# Patient Record
Sex: Female | Born: 1975 | Race: Black or African American | Hispanic: No | Marital: Married | Smoking: Current every day smoker
Health system: Southern US, Community
[De-identification: ages and names within clinical notes are randomized; demographics above are authoritative.]

## PROBLEM LIST (undated history)

## (undated) DIAGNOSIS — Q249 Congenital malformation of heart, unspecified: Secondary | ICD-10-CM

## (undated) HISTORY — PX: CARDIAC SURGERY: SHX584

---

## 2014-12-11 ENCOUNTER — Emergency Department (HOSPITAL_COMMUNITY): Payer: Medicare Other

## 2014-12-11 ENCOUNTER — Emergency Department (HOSPITAL_COMMUNITY)
Admission: EM | Admit: 2014-12-11 | Discharge: 2014-12-11 | Disposition: A | Discharge status: Home / Self Care | Payer: Medicare Other | Attending: Emergency Medicine | Admitting: Emergency Medicine

## 2014-12-11 ENCOUNTER — Encounter (HOSPITAL_COMMUNITY): Payer: Self-pay | Admitting: *Deleted

## 2014-12-11 DIAGNOSIS — Y9389 Activity, other specified: Secondary | ICD-10-CM | POA: Diagnosis not present

## 2014-12-11 DIAGNOSIS — S01511A Laceration without foreign body of lip, initial encounter: Secondary | ICD-10-CM | POA: Diagnosis not present

## 2014-12-11 DIAGNOSIS — F1410 Cocaine abuse, uncomplicated: Secondary | ICD-10-CM | POA: Insufficient documentation

## 2014-12-11 DIAGNOSIS — Y9289 Other specified places as the place of occurrence of the external cause: Secondary | ICD-10-CM | POA: Insufficient documentation

## 2014-12-11 DIAGNOSIS — Z720 Tobacco use: Secondary | ICD-10-CM | POA: Insufficient documentation

## 2014-12-11 DIAGNOSIS — Z880 Allergy status to penicillin: Secondary | ICD-10-CM | POA: Insufficient documentation

## 2014-12-11 DIAGNOSIS — F1210 Cannabis abuse, uncomplicated: Secondary | ICD-10-CM | POA: Insufficient documentation

## 2014-12-11 DIAGNOSIS — Y998 Other external cause status: Secondary | ICD-10-CM | POA: Insufficient documentation

## 2014-12-11 DIAGNOSIS — Z3202 Encounter for pregnancy test, result negative: Secondary | ICD-10-CM | POA: Diagnosis not present

## 2014-12-11 DIAGNOSIS — T148 Other injury of unspecified body region: Secondary | ICD-10-CM | POA: Diagnosis not present

## 2014-12-11 DIAGNOSIS — S0990XA Unspecified injury of head, initial encounter: Secondary | ICD-10-CM | POA: Diagnosis present

## 2014-12-11 DIAGNOSIS — S060X1A Concussion with loss of consciousness of 30 minutes or less, initial encounter: Secondary | ICD-10-CM | POA: Insufficient documentation

## 2014-12-11 DIAGNOSIS — T148XXA Other injury of unspecified body region, initial encounter: Secondary | ICD-10-CM

## 2014-12-11 HISTORY — DX: Congenital malformation of heart, unspecified: Q24.9

## 2014-12-11 LAB — RAPID URINE DRUG SCREEN, HOSP PERFORMED
AMPHETAMINES: NOT DETECTED
BARBITURATES: NOT DETECTED
BENZODIAZEPINES: NOT DETECTED
COCAINE: POSITIVE — AB
Opiates: NOT DETECTED
TETRAHYDROCANNABINOL: POSITIVE — AB

## 2014-12-11 LAB — URINALYSIS, ROUTINE W REFLEX MICROSCOPIC
BILIRUBIN URINE: NEGATIVE
GLUCOSE, UA: NEGATIVE mg/dL
Hgb urine dipstick: NEGATIVE
KETONES UR: NEGATIVE mg/dL
Leukocytes, UA: NEGATIVE
Nitrite: NEGATIVE
PH: 5.5 (ref 5.0–8.0)
PROTEIN: NEGATIVE mg/dL
Specific Gravity, Urine: 1.01 (ref 1.005–1.030)
UROBILINOGEN UA: 0.2 mg/dL (ref 0.0–1.0)

## 2014-12-11 LAB — CBC WITH DIFFERENTIAL/PLATELET
BASOS PCT: 1 % (ref 0–1)
Basophils Absolute: 0 10*3/uL (ref 0.0–0.1)
EOS PCT: 1 % (ref 0–5)
Eosinophils Absolute: 0 10*3/uL (ref 0.0–0.7)
HCT: 37.7 % (ref 36.0–46.0)
HEMOGLOBIN: 12.6 g/dL (ref 12.0–15.0)
LYMPHS ABS: 1.4 10*3/uL (ref 0.7–4.0)
Lymphocytes Relative: 32 % (ref 12–46)
MCH: 29.9 pg (ref 26.0–34.0)
MCHC: 33.4 g/dL (ref 30.0–36.0)
MCV: 89.5 fL (ref 78.0–100.0)
MONO ABS: 0.4 10*3/uL (ref 0.1–1.0)
MONOS PCT: 9 % (ref 3–12)
NEUTROS ABS: 2.5 10*3/uL (ref 1.7–7.7)
NEUTROS PCT: 59 % (ref 43–77)
PLATELETS: 160 10*3/uL (ref 150–400)
RBC: 4.21 MIL/uL (ref 3.87–5.11)
RDW: 13.4 % (ref 11.5–15.5)
WBC: 4.3 10*3/uL (ref 4.0–10.5)

## 2014-12-11 LAB — PREGNANCY, URINE: Preg Test, Ur: NEGATIVE

## 2014-12-11 LAB — BASIC METABOLIC PANEL
Anion gap: 8 (ref 5–15)
BUN: 10 mg/dL (ref 6–20)
CHLORIDE: 108 mmol/L (ref 101–111)
CO2: 23 mmol/L (ref 22–32)
Calcium: 8.6 mg/dL — ABNORMAL LOW (ref 8.9–10.3)
Creatinine, Ser: 1 mg/dL (ref 0.44–1.00)
GFR calc Af Amer: >60 mL/min (ref >60)
GFR calc non Af Amer: >60 mL/min (ref >60)
Glucose, Bld: 83 mg/dL (ref 65–99)
Potassium: 3.9 mmol/L (ref 3.5–5.1)
Sodium: 139 mmol/L (ref 135–145)

## 2014-12-11 LAB — ETHANOL: Alcohol, Ethyl (B): 93 mg/dL — ABNORMAL HIGH (ref <5)

## 2014-12-11 MED ORDER — FENTANYL CITRATE (PF) 100 MCG/2ML IJ SOLN
50.0000 ug | Freq: Once | INTRAMUSCULAR | Status: AC
  Administered 2014-12-11: 50 ug via INTRAVENOUS
  Filled 2014-12-11: qty 2

## 2014-12-11 MED ORDER — TETANUS-DIPHTH-ACELL PERTUSSIS 5-2.5-18.5 LF-MCG/0.5 IM SUSP
0.5000 mL | Freq: Once | INTRAMUSCULAR | Status: DC
  Filled 2014-12-11: qty 0.5
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
## 2014-12-11 NOTE — Discharge Instructions (Signed)
Follow up with dr. Gerilyn Pilgrim or a family md if any problems     Concussion A concussion, or closed-head injury, is a brain injury caused by a direct blow to the head or by a quick and sudden movement (jolt) of the head or neck. Concussions are usually not life-threatening. Even so, the effects of a concussion can be serious. If you have had a concussion before, you are more likely to experience concussion-like symptoms after a direct blow to the head.  CAUSES  Direct blow to the head, such as from running into another player during a soccer game, being hit in a fight, or hitting your head on a hard surface.  A jolt of the head or neck that causes the brain to move back and forth inside the skull, such as in a car crash. SIGNS AND SYMPTOMS The signs of a concussion can be hard to notice. Early on, they may be missed by you, family members, and health care providers. You may look fine but act or feel differently. Symptoms are usually temporary, but they may last for days, weeks, or even longer. Some symptoms may appear right away while others may not show up for hours or days. Every head injury is different. Symptoms include:  Mild to moderate headaches that will not go away.  A feeling of pressure inside your head.  Having more trouble than usual:  Learning or remembering things you have heard.  Answering questions.  Paying attention or concentrating.  Organizing daily tasks.  Making decisions and solving problems.  Slowness in thinking, acting or reacting, speaking, or reading.  Getting lost or being easily confused.  Feeling tired all the time or lacking energy (fatigued).  Feeling drowsy.  Sleep disturbances.  Sleeping more than usual.  Sleeping less than usual.  Trouble falling asleep.  Trouble sleeping (insomnia).  Loss of balance or feeling lightheaded or dizzy.  Nausea or vomiting.  Numbness or tingling.  Increased sensitivity  to:  Sounds.  Lights.  Distractions.  Vision problems or eyes that tire easily.  Diminished sense of taste or smell.  Ringing in the ears.  Mood changes such as feeling sad or anxious.  Becoming easily irritated or angry for little or no reason.  Lack of motivation.  Seeing or hearing things other people do not see or hear (hallucinations). DIAGNOSIS Your health care provider can usually diagnose a concussion based on a description of your injury and symptoms. He or she will ask whether you passed out (lost consciousness) and whether you are having trouble remembering events that happened right before and during your injury. Your evaluation might include:  A brain scan to look for signs of injury to the brain. Even if the test shows no injury, you may still have a concussion.  Blood tests to be sure other problems are not present. TREATMENT  Concussions are usually treated in an emergency department, in urgent care, or at a clinic. You may need to stay in the hospital overnight for further treatment.  Tell your health care provider if you are taking any medicines, including prescription medicines, over-the-counter medicines, and natural remedies. Some medicines, such as blood thinners (anticoagulants) and aspirin, may increase the chance of complications. Also tell your health care provider whether you have had alcohol or are taking illegal drugs. This information may affect treatment.  Your health care provider will send you home with important instructions to follow.  How fast you will recover from a concussion depends on many factors.  These factors include how severe your concussion is, what part of your brain was injured, your age, and how healthy you were before the concussion.  Most people with mild injuries recover fully. Recovery can take time. In general, recovery is slower in older persons. Also, persons who have had a concussion in the past or have other medical  problems may find that it takes longer to recover from their current injury. HOME CARE INSTRUCTIONS General Instructions  Carefully follow the directions your health care provider gave you.  Only take over-the-counter or prescription medicines for pain, discomfort, or fever as directed by your health care provider.  Take only those medicines that your health care provider has approved.  Do not drink alcohol until your health care provider says you are well enough to do so. Alcohol and certain other drugs may slow your recovery and can put you at risk of further injury.  If it is harder than usual to remember things, write them down.  If you are easily distracted, try to do one thing at a time. For example, do not try to watch TV while fixing dinner.  Talk with family members or close friends when making important decisions.  Keep all follow-up appointments. Repeated evaluation of your symptoms is recommended for your recovery.  Watch your symptoms and tell others to do the same. Complications sometimes occur after a concussion. Older adults with a brain injury may have a higher risk of serious complications, such as a blood clot on the brain.  Tell your teachers, school nurse, school counselor, coach, athletic trainer, or work Freight forwarder about your injury, symptoms, and restrictions. Tell them about what you can or cannot do. They should watch for:  Increased problems with attention or concentration.  Increased difficulty remembering or learning new information.  Increased time needed to complete tasks or assignments.  Increased irritability or decreased ability to cope with stress.  Increased symptoms.  Rest. Rest helps the brain to heal. Make sure you:  Get plenty of sleep at night. Avoid staying up late at night.  Keep the same bedtime hours on weekends and weekdays.  Rest during the day. Take daytime naps or rest breaks when you feel tired.  Limit activities that require a  lot of thought or concentration. These include:  Doing homework or job-related work.  Watching TV.  Working on the computer.  Avoid any situation where there is potential for another head injury (football, hockey, soccer, basketball, martial arts, downhill snow sports and horseback riding). Your condition will get worse every time you experience a concussion. You should avoid these activities until you are evaluated by the appropriate follow-up health care providers. Returning To Your Regular Activities You will need to return to your normal activities slowly, not all at once. You must give your body and brain enough time for recovery.  Do not return to sports or other athletic activities until your health care provider tells you it is safe to do so.  Ask your health care provider when you can drive, ride a bicycle, or operate heavy machinery. Your ability to react may be slower after a brain injury. Never do these activities if you are dizzy.  Ask your health care provider about when you can return to work or school. Preventing Another Concussion It is very important to avoid another brain injury, especially before you have recovered. In rare cases, another injury can lead to permanent brain damage, brain swelling, or death. The risk of this is greatest  during the first 7-10 days after a head injury. Avoid injuries by:  Wearing a seat belt when riding in a car.  Drinking alcohol only in moderation.  Wearing a helmet when biking, skiing, skateboarding, skating, or doing similar activities.  Avoiding activities that could lead to a second concussion, such as contact or recreational sports, until your health care provider says it is okay.  Taking safety measures in your home.  Remove clutter and tripping hazards from floors and stairways.  Use grab bars in bathrooms and handrails by stairs.  Place non-slip mats on floors and in bathtubs.  Improve lighting in dim areas. SEEK MEDICAL  CARE IF:  You have increased problems paying attention or concentrating.  You have increased difficulty remembering or learning new information.  You need more time to complete tasks or assignments than before.  You have increased irritability or decreased ability to cope with stress.  You have more symptoms than before. Seek medical care if you have any of the following symptoms for more than 2 weeks after your injury:  Lasting (chronic) headaches.  Dizziness or balance problems.  Nausea.  Vision problems.  Increased sensitivity to noise or light.  Depression or mood swings.  Anxiety or irritability.  Memory problems.  Difficulty concentrating or paying attention.  Sleep problems.  Feeling tired all the time. SEEK IMMEDIATE MEDICAL CARE IF:  You have severe or worsening headaches. These may be a sign of a blood clot in the brain.  You have weakness (even if only in one hand, leg, or part of the face).  You have numbness.  You have decreased coordination.  You vomit repeatedly.  You have increased sleepiness.  One pupil is larger than the other.  You have convulsions.  You have slurred speech.  You have increased confusion. This may be a sign of a blood clot in the brain.  You have increased restlessness, agitation, or irritability.  You are unable to recognize people or places.  You have neck pain.  It is difficult to wake you up.  You have unusual behavior changes.  You lose consciousness. MAKE SURE YOU:  Understand these instructions.  Will watch your condition.  Will get help right away if you are not doing well or get worse. Document Released: 07/07/2003 Document Revised: 04/21/2013 Document Reviewed: 11/06/2012 [facility] Patient Information 2015 [place], [place]. This information is not intended to replace advice given to you by your health care provider. Make sure you discuss any questions you have with your health care  provider.  Contusion A contusion is a deep bruise. Contusions are the result of an injury that caused bleeding under the skin. The contusion may turn blue, purple, or yellow. Minor injuries will give you a painless contusion, but more severe contusions may stay painful and swollen for a few weeks.  CAUSES  A contusion is usually caused by a blow, trauma, or direct force to an area of the body. SYMPTOMS   Swelling and redness of the injured area.  Bruising of the injured area.  Tenderness and soreness of the injured area.  Pain. DIAGNOSIS  The diagnosis can be made by taking a history and physical exam. An X-ray, CT scan, or MRI may be needed to determine if there were any associated injuries, such as fractures. TREATMENT  Specific treatment will depend on what area of the body was injured. In general, the best treatment for a contusion is resting, icing, elevating, and applying cold compresses to the injured area. Over-the-counter  medicines may also be recommended for pain control. Ask your caregiver what the best treatment is for your contusion. HOME CARE INSTRUCTIONS   Put ice on the injured area.  Put ice in a plastic bag.  Place a towel between your skin and the bag.  Leave the ice on for 15-20 minutes, 3-4 times a day, or as directed by your health care provider.  Only take over-the-counter or prescription medicines for pain, discomfort, or fever as directed by your caregiver. Your caregiver may recommend avoiding anti-inflammatory medicines (aspirin, ibuprofen, and naproxen) for 48 hours because these medicines may increase bruising.  Rest the injured area.  If possible, elevate the injured area to reduce swelling. SEEK IMMEDIATE MEDICAL CARE IF:   You have increased bruising or swelling.  You have pain that is getting worse.  Your swelling or pain is not relieved with medicines. MAKE SURE YOU:   Understand these instructions.  Will watch your condition.  Will get  help right away if you are not doing well or get worse. Document Released: 01/24/2005 Document Revised: 04/21/2013 Document Reviewed: 02/19/2011 [facility] Patient Information 2015 [place], [place]. This information is not intended to replace advice given to you by your health care provider. Make sure you discuss any questions you have with your health care provider.
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
## 2019-11-15 ENCOUNTER — Encounter (HOSPITAL_COMMUNITY): Payer: Self-pay | Admitting: Emergency Medicine

## 2019-11-15 ENCOUNTER — Emergency Department (HOSPITAL_COMMUNITY)
Admission: EM | Admit: 2019-11-15 | Discharge: 2019-11-15 | Disposition: A | Discharge status: Home / Self Care | Payer: Medicaid Other | Attending: Emergency Medicine | Admitting: Emergency Medicine

## 2019-11-15 ENCOUNTER — Other Ambulatory Visit: Payer: Self-pay

## 2019-11-15 DIAGNOSIS — Y9389 Activity, other specified: Secondary | ICD-10-CM | POA: Insufficient documentation

## 2019-11-15 DIAGNOSIS — Y9289 Other specified places as the place of occurrence of the external cause: Secondary | ICD-10-CM | POA: Diagnosis not present

## 2019-11-15 DIAGNOSIS — T192XXA Foreign body in vulva and vagina, initial encounter: Secondary | ICD-10-CM | POA: Diagnosis not present

## 2019-11-15 DIAGNOSIS — F17200 Nicotine dependence, unspecified, uncomplicated: Secondary | ICD-10-CM | POA: Diagnosis not present

## 2019-11-15 DIAGNOSIS — Y999 Unspecified external cause status: Secondary | ICD-10-CM | POA: Insufficient documentation

## 2019-11-15 LAB — URINALYSIS, ROUTINE W REFLEX MICROSCOPIC
Bilirubin Urine: NEGATIVE
Glucose, UA: NEGATIVE mg/dL
Ketones, ur: NEGATIVE mg/dL
Leukocytes,Ua: NEGATIVE
Nitrite: NEGATIVE
Protein, ur: NEGATIVE mg/dL
Specific Gravity, Urine: 1.013 (ref 1.005–1.030)
pH: 5 (ref 5.0–8.0)

## 2019-11-15 LAB — WET PREP, GENITAL
Clue Cells Wet Prep HPF POC: NONE SEEN
Sperm: NONE SEEN
Trich, Wet Prep: NONE SEEN
Yeast Wet Prep HPF POC: NONE SEEN

## 2019-11-15 LAB — PREGNANCY, URINE: Preg Test, Ur: NEGATIVE

## 2019-11-15 MED ORDER — DOXYCYCLINE HYCLATE 100 MG PO CAPS: 100.0000 mg | ORAL_CAPSULE | Freq: Two times a day (BID) | ORAL | 0 refills | Status: AC

## 2019-11-15 MED ORDER — CEFTRIAXONE SODIUM 500 MG IJ SOLR
500.0000 mg | Freq: Once | INTRAMUSCULAR | Status: AC
  Administered 2019-11-15: 500 mg via INTRAMUSCULAR
  Filled 2019-11-15: qty 500

## 2019-11-15 MED ORDER — DOXYCYCLINE HYCLATE 100 MG PO TABS
100.0000 mg | ORAL_TABLET | Freq: Once | ORAL | Status: AC
  Administered 2019-11-15: 100 mg via ORAL
  Filled 2019-11-15: qty 1

## 2019-11-15 MED ORDER — STERILE WATER FOR INJECTION IJ SOLN
INTRAMUSCULAR | Status: AC
  Administered 2019-11-15: 10 mL
  Filled 2019-11-15: qty 10

## 2019-11-15 NOTE — ED Provider Notes (Signed)
[facility] EMERGENCY DEPARTMENT Provider Note   CSN: [id number] Arrival date & time: 11/15/19  0522     History Chief Complaint  Patient presents with  . Foreign Body in Vagina    Pa Downard is a 44 y.o. female.  Patient reports condom stuck in the vagina after having intercourse today with a new partner.  Happened just prior to arrival.  Has discomfort and pain in her vagina.  No bleeding or discharge.  Denies any pain with urination or blood in the urine.  No fever or vomiting.  No abdominal pain or back pain.  Denies possibility of pregnancy. States this is a new partner today and she is worried about being exposed to something he has.  Chart review shows she was treated for trichomonas at [facility] in June.  The history is provided by the patient.  Foreign Body in Vagina Pertinent negatives include no chest pain, no abdominal pain, no headaches and no shortness of breath.       Past Medical History:  Diagnosis Date  . Congenital heart defect     There are no problems to display for this patient.   Past Surgical History:  Procedure Laterality Date  . CARDIAC SURGERY       OB History   No obstetric history on file.     No family history on file.  Social History   Tobacco Use  . Smoking status: Current Every Day Smoker  Substance Use Topics  . Alcohol use: Yes  . Drug use: Yes    Home Medications Prior to Admission medications   Not on File    Allergies    Amoxicillin and Penicillins  Review of Systems   Review of Systems  Constitutional: Negative for activity change, appetite change and fever.  HENT: Negative for congestion and rhinorrhea.   Eyes: Negative for visual disturbance.  Respiratory: Negative for cough, chest tightness and shortness of breath.   Cardiovascular: Negative for chest pain.  Gastrointestinal: Negative for abdominal pain.  Genitourinary: Negative for dysuria, flank pain, hematuria, vaginal bleeding and vaginal  discharge.  Musculoskeletal: Negative for arthralgias and myalgias.  Neurological: Negative for dizziness, weakness and headaches.   all other systems are negative except as noted in the HPI and PMH.    Physical Exam Updated Vital Signs BP 103/76 (BP Location: Right Arm)   Pulse 76   Temp 98.4 F (36.9 C) (Oral)   Resp 17   LMP 11/08/2019   SpO2 99%   Physical Exam Vitals and nursing note reviewed.  Constitutional:      General: She is not in acute distress.    Appearance: She is well-developed. She is obese.  HENT:     Head: Normocephalic and atraumatic.     Mouth/Throat:     Pharynx: No oropharyngeal exudate.  Eyes:     Conjunctiva/sclera: Conjunctivae normal.     Pupils: Pupils are equal, round, and reactive to light.  Neck:     Comments: No meningismus. Cardiovascular:     Rate and Rhythm: Normal rate and regular rhythm.     Heart sounds: Normal heart sounds. No murmur heard.   Pulmonary:     Effort: Pulmonary effort is normal. No respiratory distress.     Breath sounds: Normal breath sounds.  Abdominal:     Palpations: Abdomen is soft.     Tenderness: There is no abdominal tenderness. There is no guarding or rebound.  Genitourinary:    Comments: Chaperone present.  Normal external  genitalia.  Condom visualized easily with speculum and removed with McGill forceps.  No CMT.  No lateralized adnexal tenderness.  No vaginal bleeding or discharge. Musculoskeletal:        General: No tenderness. Normal range of motion.     Cervical back: Normal range of motion and neck supple.  Skin:    General: Skin is warm.  Neurological:     Mental Status: She is alert and oriented to person, place, and time.     Cranial Nerves: No cranial nerve deficit.     Motor: No abnormal muscle tone.     Coordination: Coordination normal.     Comments:  5/5 strength throughout. CN 2-12 intact.Equal grip strength.   Psychiatric:        Behavior: Behavior normal.     ED Results /  Procedures / Treatments   Labs (all labs ordered are listed, but only abnormal results are displayed) Labs Reviewed  WET PREP, GENITAL - Abnormal; Notable for the following components:      Result Value   WBC, Wet Prep HPF POC FEW (*)    All other components within normal limits  URINALYSIS, ROUTINE W REFLEX MICROSCOPIC - Abnormal; Notable for the following components:   Hgb urine dipstick SMALL (*)    Bacteria, UA RARE (*)    All other components within normal limits  PREGNANCY, URINE  GC/CHLAMYDIA PROBE AMP ([place]) NOT AT [facility]    EKG None  Radiology No results found.  Procedures .Foreign Body Removal  Date/Time: 11/15/2019 5:47 AM Performed by: Glynn Octave, MD Authorized by: Glynn Octave, MD  Body area: vagina Anesthesia method: none.  Sedation: Patient sedated: no  Patient restrained: no Patient cooperative: yes Localization method: visualized Removal mechanism: ring forceps Complexity: simple 1 objects recovered. Objects recovered: condom Post-procedure assessment: foreign body removed Patient tolerance: patient tolerated the procedure well with no immediate complications   (including critical care time)  Medications Ordered in ED Medications - No data to display  ED Course  I have reviewed the triage vital signs and the nursing notes.  Pertinent labs & imaging results that were available during my care of the patient were reviewed by me and considered in my medical decision making (see chart for details).    MDM Rules/Calculators/A&P                         Foreign body in vagina.  Condom removed as above. Abdomen soft without peritoneal signs.   Patient requesting STD testing as well as empiric treatment for possible gonorrhea and chlamydia.  hCG is negative.  Patient treated empirically with Rocephin and doxycycline.  Wet prep is negative for trichomonas or clue cells. Follow-up with gynecology.  Advised to have her sexual partner  treated as well.  Return to the ED with worsening pain, fever, vomiting, other concerns. Final Clinical Impression(s) / ED Diagnoses Final diagnoses:  Foreign body in vagina, initial encounter    Rx / DC Orders ED Discharge Orders    None       Neno Hohensee, Jeannett Senior, MD 11/15/19 [phone]

## 2019-11-15 NOTE — Discharge Instructions (Signed)
Take the antibiotics as prescribed.  You were treated for gonorrhea and chlamydia.  You may check the results online in the next 1 to 2 days.  Follow-up with a gynecologist.  Return to the ED with worsening pain, fever, vomiting, or other concerns.

## 2019-11-15 NOTE — ED Triage Notes (Signed)
Pt reports condom stuck in vagina. Pt states it happened just prior to arriving in the ED tonight.

## 2019-11-16 LAB — GC/CHLAMYDIA PROBE AMP (~~LOC~~) NOT AT ARMC
Chlamydia: NEGATIVE
Comment: NEGATIVE
Comment: NORMAL
Neisseria Gonorrhea: NEGATIVE

## 2021-03-04 ENCOUNTER — Emergency Department (HOSPITAL_COMMUNITY): Payer: Medicaid Other

## 2021-03-04 ENCOUNTER — Encounter (HOSPITAL_COMMUNITY): Payer: Self-pay | Admitting: Emergency Medicine

## 2021-03-04 ENCOUNTER — Other Ambulatory Visit: Payer: Self-pay

## 2021-03-04 ENCOUNTER — Emergency Department (HOSPITAL_COMMUNITY)
Admission: EM | Admit: 2021-03-04 | Discharge: 2021-03-04 | Disposition: A | Discharge status: Home / Self Care | Payer: Medicaid Other | Attending: Emergency Medicine | Admitting: Emergency Medicine

## 2021-03-04 DIAGNOSIS — R1032 Left lower quadrant pain: Secondary | ICD-10-CM | POA: Insufficient documentation

## 2021-03-04 DIAGNOSIS — R109 Unspecified abdominal pain: Secondary | ICD-10-CM

## 2021-03-04 DIAGNOSIS — R1084 Generalized abdominal pain: Secondary | ICD-10-CM | POA: Insufficient documentation

## 2021-03-04 DIAGNOSIS — F17200 Nicotine dependence, unspecified, uncomplicated: Secondary | ICD-10-CM | POA: Insufficient documentation

## 2021-03-04 DIAGNOSIS — R1031 Right lower quadrant pain: Secondary | ICD-10-CM | POA: Diagnosis not present

## 2021-03-04 LAB — COMPREHENSIVE METABOLIC PANEL
ALT: 15 U/L (ref 0–44)
AST: 33 U/L (ref 15–41)
Albumin: 3.8 g/dL (ref 3.5–5.0)
Alkaline Phosphatase: 52 U/L (ref 38–126)
Anion gap: 8 (ref 5–15)
BUN: 16 mg/dL (ref 6–20)
CO2: 21 mmol/L — ABNORMAL LOW (ref 22–32)
Calcium: 8.8 mg/dL — ABNORMAL LOW (ref 8.9–10.3)
Chloride: 106 mmol/L (ref 98–111)
Creatinine, Ser: 0.8 mg/dL (ref 0.44–1.00)
GFR, Estimated: >60 mL/min (ref >60)
Glucose, Bld: 100 mg/dL — ABNORMAL HIGH (ref 70–99)
Potassium: 3.8 mmol/L (ref 3.5–5.1)
Sodium: 135 mmol/L (ref 135–145)
Total Bilirubin: 0.3 mg/dL (ref 0.3–1.2)
Total Protein: 7.5 g/dL (ref 6.5–8.1)

## 2021-03-04 LAB — CBC
HCT: 39.7 % (ref 36.0–46.0)
Hemoglobin: 12.9 g/dL (ref 12.0–15.0)
MCH: 28.9 pg (ref 26.0–34.0)
MCHC: 32.5 g/dL (ref 30.0–36.0)
MCV: 89 fL (ref 80.0–100.0)
Platelets: 175 10*3/uL (ref 150–400)
RBC: 4.46 MIL/uL (ref 3.87–5.11)
RDW: 14.1 % (ref 11.5–15.5)
WBC: 5.7 10*3/uL (ref 4.0–10.5)
nRBC: 0 % (ref 0.0–0.2)

## 2021-03-04 LAB — URINALYSIS, ROUTINE W REFLEX MICROSCOPIC
Bilirubin Urine: NEGATIVE
Glucose, UA: NEGATIVE mg/dL
Hgb urine dipstick: NEGATIVE
Ketones, ur: NEGATIVE mg/dL
Leukocytes,Ua: NEGATIVE
Nitrite: NEGATIVE
Protein, ur: NEGATIVE mg/dL
Specific Gravity, Urine: 1.021 (ref 1.005–1.030)
pH: 6 (ref 5.0–8.0)

## 2021-03-04 LAB — PREGNANCY, URINE: Preg Test, Ur: NEGATIVE

## 2021-03-04 LAB — LIPASE, BLOOD: Lipase: 181 U/L — ABNORMAL HIGH (ref 11–51)

## 2021-03-04 MED ORDER — OMEPRAZOLE 20 MG PO CPDR: 20.0000 mg | DELAYED_RELEASE_CAPSULE | Freq: Every day | ORAL | 1 refills | Status: AC

## 2021-03-04 MED ORDER — SODIUM CHLORIDE 0.9 % IV BOLUS
1000.0000 mL | Freq: Once | INTRAVENOUS | Status: AC
  Administered 2021-03-04: 1000 mL via INTRAVENOUS

## 2021-03-04 MED ORDER — FAMOTIDINE IN NACL 20-0.9 MG/50ML-% IV SOLN
20.0000 mg | Freq: Once | INTRAVENOUS | Status: AC
  Administered 2021-03-04: 20 mg via INTRAVENOUS
  Filled 2021-03-04: qty 50

## 2021-03-04 MED ORDER — HALOPERIDOL LACTATE 5 MG/ML IJ SOLN
2.0000 mg | Freq: Once | INTRAMUSCULAR | Status: AC
  Administered 2021-03-04: 2 mg via INTRAVENOUS
  Filled 2021-03-04: qty 1

## 2021-03-04 MED ORDER — IOHEXOL 300 MG/ML  SOLN
100.0000 mL | Freq: Once | INTRAMUSCULAR | Status: AC | PRN
  Administered 2021-03-04: 100 mL via INTRAVENOUS

## 2021-03-04 MED ORDER — OXYCODONE HCL 5 MG PO TABS: 5.0000 mg | ORAL_TABLET | ORAL | 0 refills | Status: AC | PRN

## 2021-03-04 MED ORDER — SUCRALFATE 1 G PO TABS: 1.0000 g | ORAL_TABLET | Freq: Four times a day (QID) | ORAL | 0 refills | Status: AC | PRN

## 2021-03-04 MED ORDER — HYDROMORPHONE HCL 1 MG/ML IJ SOLN
1.0000 mg | Freq: Once | INTRAMUSCULAR | Status: AC
  Administered 2021-03-04: 1 mg via INTRAVENOUS
  Filled 2021-03-04: qty 1

## 2021-03-04 MED ORDER — ACETAMINOPHEN 500 MG PO TABS
1000.0000 mg | ORAL_TABLET | Freq: Once | ORAL | Status: AC
  Administered 2021-03-04: 1000 mg via ORAL
  Filled 2021-03-04: qty 2

## 2021-03-04 NOTE — Discharge Instructions (Signed)
You were evaluated in the Emergency Department and after careful evaluation, we did not find any emergent condition requiring admission or further testing in the hospital.  Your exam/testing today is overall reassuring.  Recommend using the omeprazole medication daily to prevent pain.  Use the Carafate medication up to 4 times daily for discomfort.  Can also use the oxycodone medicine for more significant pain.  Limit your diet to liquids or broths for the next 2 days as we discussed.  Please return to the Emergency Department if you experience any worsening of your condition.   Thank you for allowing [place] to be a part of your care.

## 2021-03-04 NOTE — ED Provider Notes (Signed)
[facility]-EMERGENCY DEPT [facility] Emergency Department Provider Note MRN:  [id number]  Arrival date & time: 03/04/21     Chief Complaint   Abdominal Pain   History of Present Illness   Kristina Hebert is a 45 y.o. year-old female with no pertinent past medical history presenting to the ED with chief complaint of abdominal pain.  Location: Diffuse, worst in the lower quadrants Duration: 2 days Onset: Gradual Timing: Constant Description: Dull ache, bloated Severity: Moderate Exacerbating/Alleviating Factors: None Associated Symptoms: White stools, only 1 day of vaginal bleeding in the past 3 months Pertinent Negatives: No fever, no vomiting, no chest pain or shortness of breath  Additional History: None  Review of Systems  A complete 10 system review of systems was obtained and all systems are negative except as noted in the HPI and PMH.   Patient's Health History    Past Medical History:  Diagnosis Date   Congenital heart defect     Past Surgical History:  Procedure Laterality Date   CARDIAC SURGERY      History reviewed. No pertinent family history.  Social History   Socioeconomic History   Marital status: Married    Spouse name: Not on file   Number of children: Not on file   Years of education: Not on file   Highest education level: Not on file  Occupational History   Not on file  Tobacco Use   Smoking status: Every Day   Smokeless tobacco: Never  Substance and Sexual Activity   Alcohol use: Yes   Drug use: Yes   Sexual activity: Not on file  Other Topics Concern   Not on file  Social History Narrative   Not on file   Social Determinants of Health   Financial Resource Strain: Not on file  Food Insecurity: Not on file  Transportation Needs: Not on file  Physical Activity: Not on file  Stress: Not on file  Social Connections: Not on file  Intimate Partner Violence: Not on file     Physical Exam   Vitals:   03/04/21 0500 03/04/21 0600  BP:  115/73 98/67  Pulse: (!) 52 (!) 53  Resp: 18 18  Temp:    SpO2: 100% 100%    CONSTITUTIONAL: Well-appearing, NAD NEURO:  Alert and oriented x 3, no focal deficits EYES:  eyes equal and reactive ENT/NECK:  no LAD, no JVD CARDIO: Regular rate, well-perfused, normal S1 and S2 PULM:  CTAB no wheezing or rhonchi GI/GU:  normal bowel sounds, firm, distended, mild diffuse tenderness MSK/SPINE:  No gross deformities, no edema SKIN:  no rash, atraumatic PSYCH:  Appropriate speech and behavior  *Additional and/or pertinent findings included in MDM below  Diagnostic and Interventional Summary    EKG Interpretation  Date/Time:    Ventricular Rate:    PR Interval:    QRS Duration:   QT Interval:    QTC Calculation:   R Axis:     Text Interpretation:         Labs Reviewed  LIPASE, BLOOD - Abnormal; Notable for the following components:      Result Value   Lipase 181 (*)    All other components within normal limits  COMPREHENSIVE METABOLIC PANEL - Abnormal; Notable for the following components:   CO2 21 (*)    Glucose, Bld 100 (*)    Calcium 8.8 (*)    All other components within normal limits  URINALYSIS, ROUTINE W REFLEX MICROSCOPIC  CBC  PREGNANCY, URINE  CT ABDOMEN PELVIS W CONTRAST  Final Result      Medications  acetaminophen (TYLENOL) tablet 1,000 mg (1,000 mg Oral Given 03/04/21 0200)  HYDROmorphone (DILAUDID) injection 1 mg (1 mg Intravenous Given 03/04/21 0242)  sodium chloride 0.9 % bolus 1,000 mL (0 mLs Intravenous Stopped 03/04/21 0411)  iohexol (OMNIPAQUE) 300 MG/ML solution 100 mL (100 mLs Intravenous Contrast Given 03/04/21 0304)  famotidine (PEPCID) IVPB 20 mg premix (0 mg Intravenous Stopped 03/04/21 0509)  HYDROmorphone (DILAUDID) injection 1 mg (1 mg Intravenous Given 03/04/21 0441)  haloperidol lactate (HALDOL) injection 2 mg (2 mg Intravenous Given 03/04/21 0527)     Procedures  /  Critical Care Procedures  ED Course and Medical Decision Making  I  have reviewed the triage vital signs, the nursing notes, and pertinent available records from the EMR.  Listed above are laboratory and imaging tests that I personally ordered, reviewed, and interpreted and then considered in my medical decision making (see below for details).  Initial suspicion for pregnancy given the lack of menstrual cycles.  hCG is negative.  He continues to have lower abdominal pain, distention.  CT pending to exclude obstruction, diverticulitis, appendicitis, pancreatitis.  Lipase is minimally elevated.      Work-up is reassuring, patient feeling much better after Haldol.  Unclear etiology of pain but nothing to suggest emergent process.  Appropriate for discharge.  Elmer Sow. Pilar Plate, MD [facility] Emergency Medicine [facility] [email or web address]  Final Clinical Impressions(s) / ED Diagnoses     ICD-10-CM   1. Abdominal pain, unspecified abdominal location  R10.9       ED Discharge Orders          Ordered    sucralfate (CARAFATE) 1 g tablet  4 times daily PRN        03/04/21 0621    omeprazole (PRILOSEC) 20 MG capsule  Daily        03/04/21 0621    oxyCODONE (ROXICODONE) 5 MG immediate release tablet  Every 4 hours PRN        03/04/21 5366             Discharge Instructions Discussed with and Provided to Patient:     Discharge Instructions      You were evaluated in the Emergency Department and after careful evaluation, we did not find any emergent condition requiring admission or further testing in the hospital.  Your exam/testing today is overall reassuring.  Recommend using the omeprazole medication daily to prevent pain.  Use the Carafate medication up to 4 times daily for discomfort.  Can also use the oxycodone medicine for more significant pain.  Limit your diet to liquids or broths for the next 2 days as we discussed.  Please return to the Emergency Department if you experience any worsening of your condition.   Thank you  for allowing [place] to be a part of your care.        Sabas Sous, MD 03/04/21 [phone]

## 2021-03-04 NOTE — ED Triage Notes (Signed)
Pt with c/o generalized abdominal pain since 27th. Pt states she is bloated and that her stools have been "white". Pt states her breasts are swollen and painful and that she only had a period for 1 day. Pt states she had unprotected sex x 1.

## 2022-10-24 IMAGING — CT CT ABD-PELV W/ CM
2 of 5 series · 17 of 46 positions shown, 19 images · IV contrast (Omnipaque or Isovue)
Comparison: None.

CLINICAL DATA: Abdominal pain.

EXAM:
CT ABDOMEN AND PELVIS WITH CONTRAST
TECHNIQUE: Multidetector CT imaging of the abdomen and pelvis was performed
using the standard protocol following bolus administration of
intravenous contrast.
CONTRAST:  100mL OMNIPAQUE IOHEXOL 300 MG/ML  SOLN

[Series 2: axial st · axial · 0.84mm/px · z∈[-813,-418]mm · 14 of 89 slices shown, 16 images]
[im 5/89  soft-tissue]
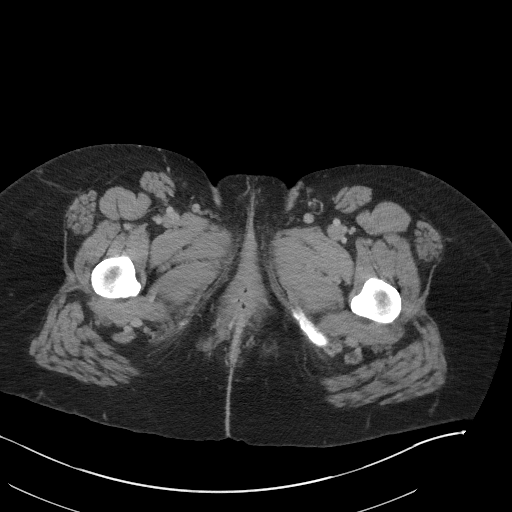
[im 5/89  bone]
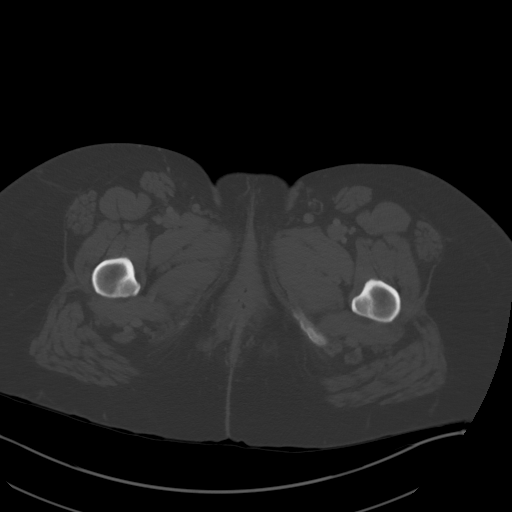
[im 10/89  soft-tissue]
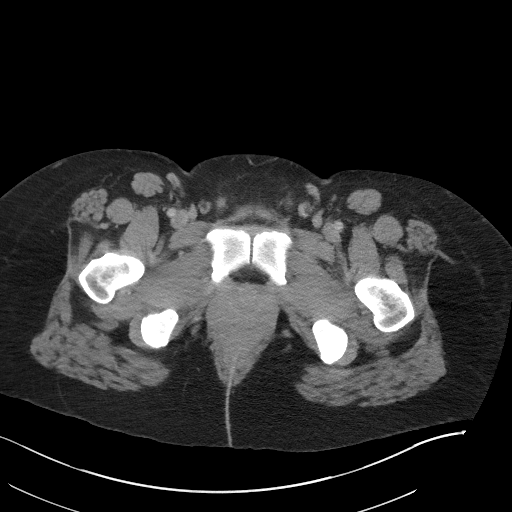
[im 20/89  soft-tissue]
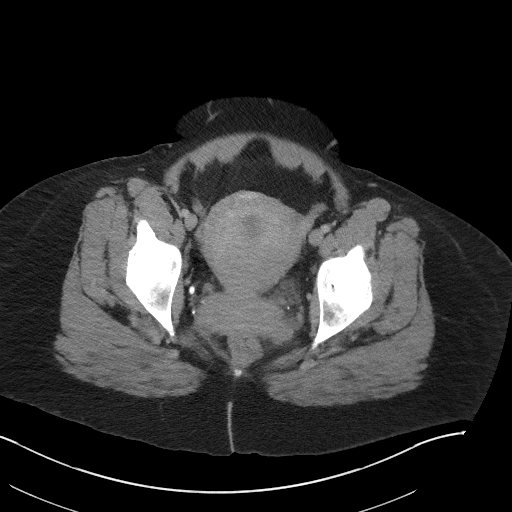
[im 25/89  soft-tissue]
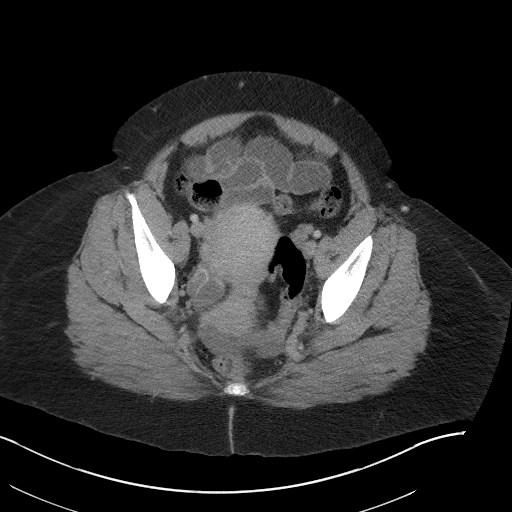
[im 30/89  soft-tissue]
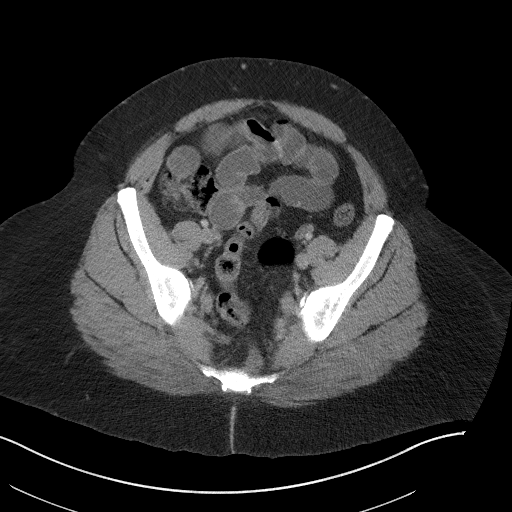
[im 35/89  soft-tissue]
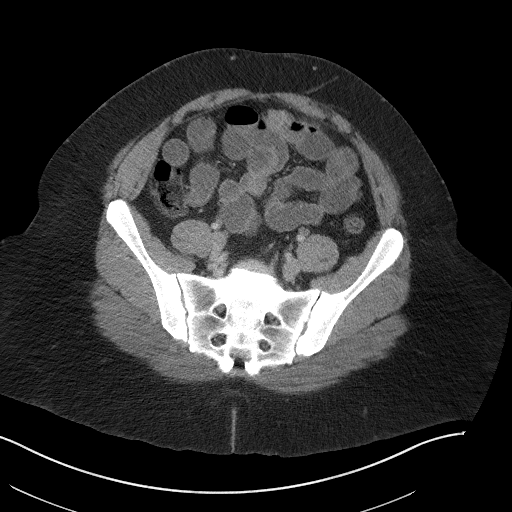
[im 40/89  soft-tissue]
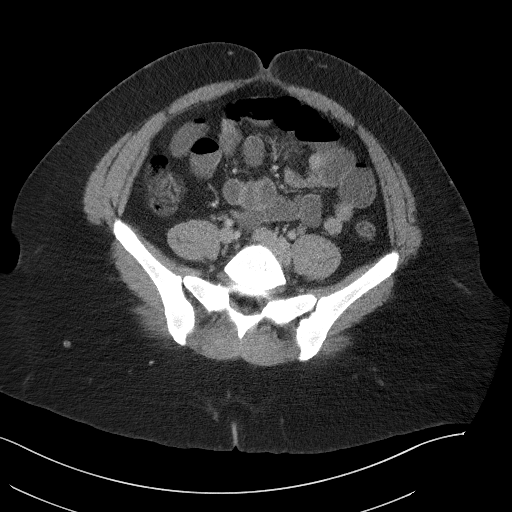
[im 49/89  soft-tissue]
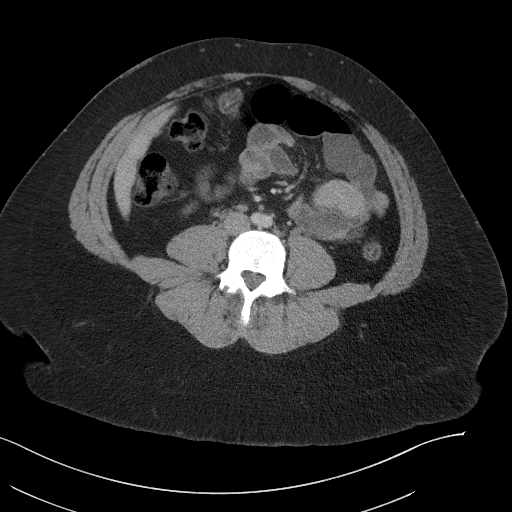
[im 54/89  soft-tissue]
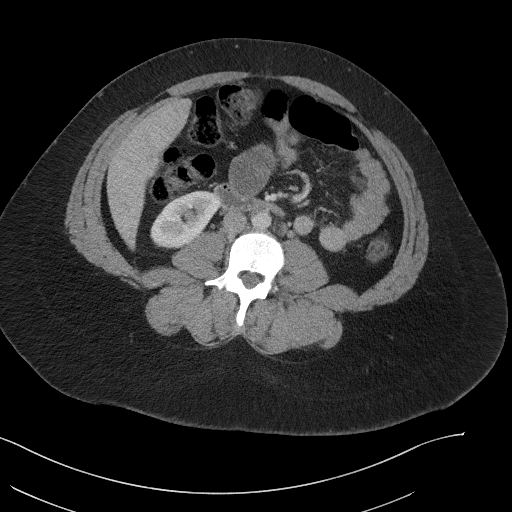
[im 54/89  bone]
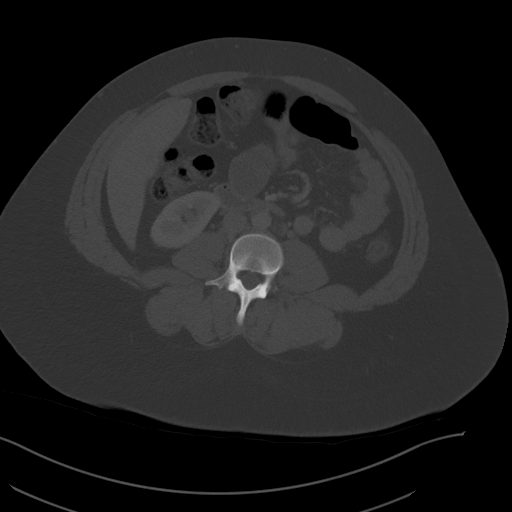
[im 59/89  soft-tissue]
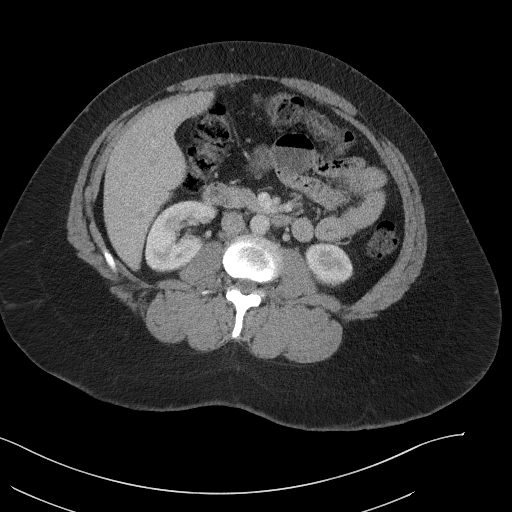
[im 64/89  soft-tissue]
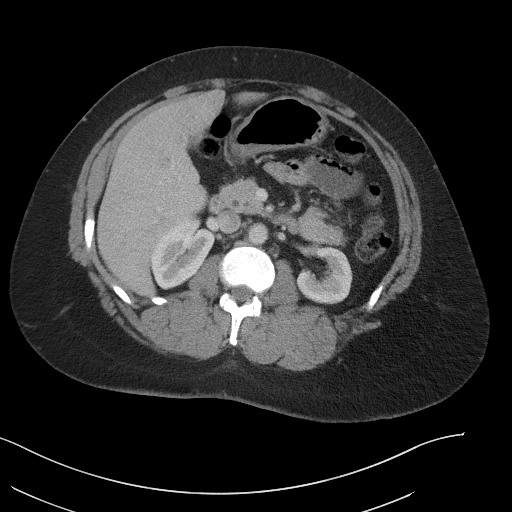
[im 69/89  soft-tissue]
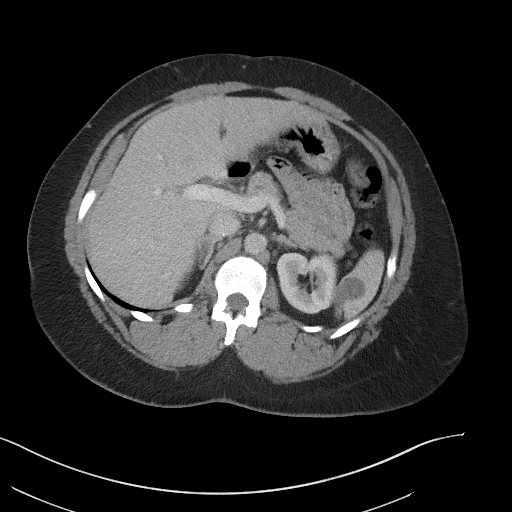
[im 79/89  soft-tissue]
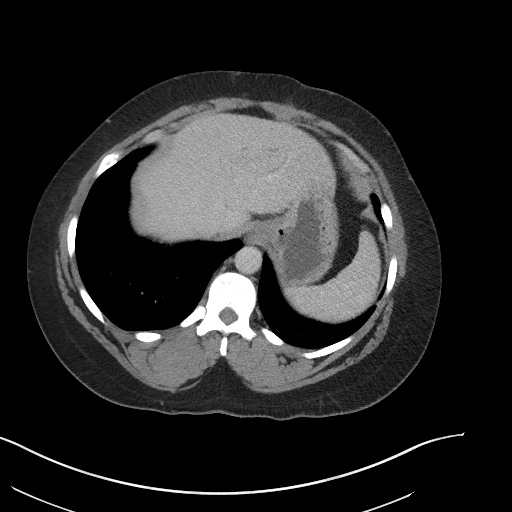
[im 84/89  soft-tissue]
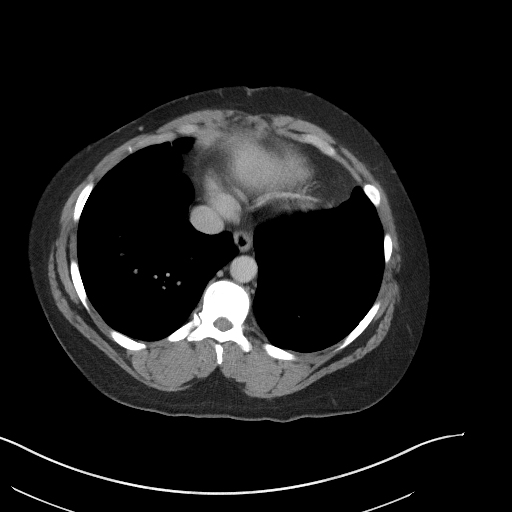

[Series 5: coronal st · coronal · 0.87mm/px · 3 of 130 slices shown]
[im 44/130  soft-tissue]
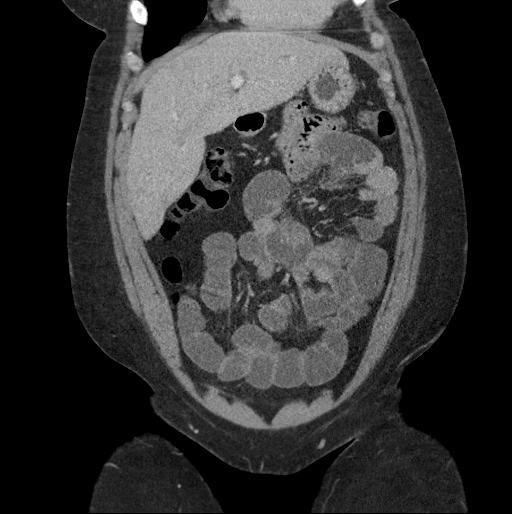
[im 58/130  soft-tissue]
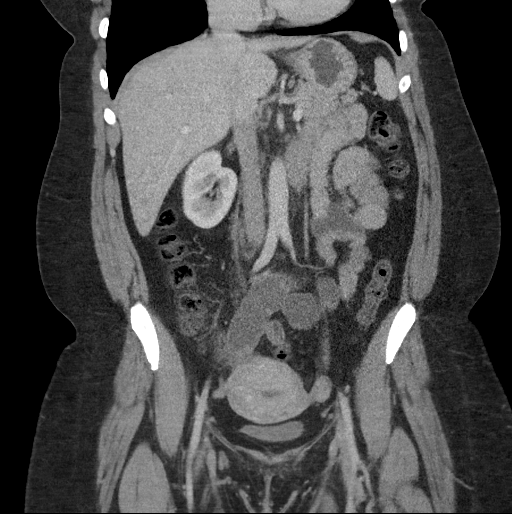
[im 72/130  soft-tissue]
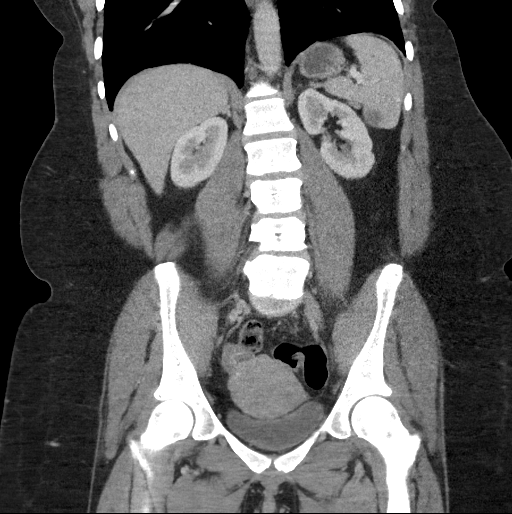

[17 of 46 positions shown; findings below may reference images not displayed]

FINDINGS: Lower chest: The visualized lung bases are clear.

No intra-abdominal free air.  Small free fluid in the pelvis.

Hepatobiliary: Subcentimeter left hepatic hypodense lesion is too
small to characterize. The liver is otherwise unremarkable.
Cholecystectomy. Mild intrahepatic biliary dilatation.

Pancreas: Unremarkable. No pancreatic ductal dilatation or
surrounding inflammatory changes.

Spleen: Indeterminate 2 cm hypodense lesion in the inferior spleen.

Adrenals/Urinary Tract: The adrenal glands unremarkable. The
kidneys, visualized ureters, and urinary bladder appear
unremarkable.

Stomach/Bowel: There is no bowel obstruction or active inflammation.
The appendix is normal.

Vascular/Lymphatic: The abdominal aorta and IVC unremarkable. No
portal venous gas. There is no adenopathy.

Reproductive: The uterus is anteverted and grossly unremarkable.
Right ovarian dominant follicle or corpus luteum. The left ovary is
unremarkable.

Other: None

Musculoskeletal: No acute or significant osseous findings.
IMPRESSION: 1. No acute intra-abdominal or pelvic pathology. No bowel
obstruction. Normal appendix.
2. Right ovarian dominant follicle or corpus luteum.
3. Indeterminate 2 cm hypodense lesion in the inferior spleen.
